# Patient Record
Sex: Male | Born: 1970
Health system: Southern US, Community
[De-identification: ages and names within clinical notes are randomized; demographics above are authoritative.]

## PROBLEM LIST (undated history)

## (undated) DIAGNOSIS — R972 Elevated prostate specific antigen [PSA]: Secondary | ICD-10-CM

## (undated) DIAGNOSIS — R12 Heartburn: Secondary | ICD-10-CM

## (undated) DIAGNOSIS — E669 Obesity, unspecified: Secondary | ICD-10-CM

## (undated) HISTORY — PX: PROSTATE BIOPSY: SHX241

## (undated) HISTORY — DX: Heartburn: R12

---

## 2010-12-24 ENCOUNTER — Ambulatory Visit: Payer: Self-pay | Admitting: Family Medicine

## 2017-11-04 DIAGNOSIS — E669 Obesity, unspecified: Secondary | ICD-10-CM | POA: Diagnosis not present

## 2017-11-04 DIAGNOSIS — E785 Hyperlipidemia, unspecified: Secondary | ICD-10-CM | POA: Diagnosis not present

## 2017-11-04 DIAGNOSIS — M25562 Pain in left knee: Secondary | ICD-10-CM | POA: Diagnosis not present

## 2017-11-18 DIAGNOSIS — M1712 Unilateral primary osteoarthritis, left knee: Secondary | ICD-10-CM | POA: Diagnosis not present

## 2018-03-16 DIAGNOSIS — H10413 Chronic giant papillary conjunctivitis, bilateral: Secondary | ICD-10-CM | POA: Diagnosis not present

## 2018-05-25 DIAGNOSIS — M1712 Unilateral primary osteoarthritis, left knee: Secondary | ICD-10-CM | POA: Diagnosis not present

## 2018-06-18 DIAGNOSIS — Z0000 Encounter for general adult medical examination without abnormal findings: Secondary | ICD-10-CM | POA: Diagnosis not present

## 2018-06-18 DIAGNOSIS — R072 Precordial pain: Secondary | ICD-10-CM | POA: Diagnosis not present

## 2018-06-18 DIAGNOSIS — R12 Heartburn: Secondary | ICD-10-CM | POA: Diagnosis not present

## 2018-08-04 DIAGNOSIS — J018 Other acute sinusitis: Secondary | ICD-10-CM | POA: Diagnosis not present

## 2018-08-04 DIAGNOSIS — H5789 Other specified disorders of eye and adnexa: Secondary | ICD-10-CM | POA: Diagnosis not present

## 2018-12-03 DIAGNOSIS — M1712 Unilateral primary osteoarthritis, left knee: Secondary | ICD-10-CM | POA: Insufficient documentation

## 2018-12-03 DIAGNOSIS — H01004 Unspecified blepharitis left upper eyelid: Secondary | ICD-10-CM | POA: Diagnosis not present

## 2018-12-03 DIAGNOSIS — H01001 Unspecified blepharitis right upper eyelid: Secondary | ICD-10-CM | POA: Diagnosis not present

## 2018-12-03 DIAGNOSIS — H16202 Unspecified keratoconjunctivitis, left eye: Secondary | ICD-10-CM | POA: Diagnosis not present

## 2019-05-09 HISTORY — PX: KNEE ARTHROSCOPY: SHX127

## 2020-08-08 ENCOUNTER — Ambulatory Visit (INDEPENDENT_AMBULATORY_CARE_PROVIDER_SITE_OTHER): Payer: 59 | Admitting: Legal Medicine

## 2020-08-08 ENCOUNTER — Encounter: Payer: Self-pay | Admitting: Legal Medicine

## 2020-08-08 ENCOUNTER — Other Ambulatory Visit: Payer: Self-pay

## 2020-08-08 DIAGNOSIS — M170 Bilateral primary osteoarthritis of knee: Secondary | ICD-10-CM

## 2020-08-08 DIAGNOSIS — Z0000 Encounter for general adult medical examination without abnormal findings: Secondary | ICD-10-CM | POA: Insufficient documentation

## 2020-08-08 NOTE — Patient Instructions (Signed)

## 2020-08-08 NOTE — Progress Notes (Signed)
Subjective:  Patient ID: Harry Drake, male    DOB: 1971-04-03  Age: 50 y.o. MRN: [id number]  Chief Complaint  Patient presents with  . Annual Exam    Physical     HPI  Well Adult Physical: Patient here for a comprehensive physical exam.The patient reports problems - osteoarthritis knees, has arthrotomy Do you take any herbs or supplements that were not prescribed by a doctor? yes Are you taking calcium supplements? yes Are you taking aspirin daily? yes  Encounter for general adult medical examination without abnormal findings  Physical ("At Risk" items are starred): Patient's last physical exam was 1 year ago .  Smoking: Life-long non-smoker ;  Physical Activity: Exercises at least 3 times per week ;  Alcohol/Drug Use: Is a non-drinker ; No illicit drug use ;  Patient is not afflicted from Stress Incontinence and Urge Incontinence  Safety: reviewed ; Patient wears a seat belt, has smoke detectors, has carbon monoxide detectors, practices appropriate gun safety, and wears sunscreen with extended sun exposure. Dental Care: biannual cleanings, brushes and flosses daily. Ophthalmology/Optometry: Annual visit.  Hearing loss: none Vision impairments: none Last PSA:            Social History   Socioeconomic History  . Marital status: Single    Spouse name: Not on file  . Number of children: Not on file  . Years of education: Not on file  . Highest education level: Not on file  Occupational History  . Occupation: Art therapist at [employer]  Tobacco Use  . Smoking status: Never Smoker  . Smokeless tobacco: Never Used  Substance and Sexual Activity  . Alcohol use: Yes    Comment: occasionally   . Drug use: Never  . Sexual activity: Not on file  Other Topics Concern  . Not on file  Social History Narrative  . Not on file   Social Determinants of Health   Financial Resource Strain: Not on file  Food Insecurity: Not on file  Transportation Needs: Not on  file  Physical Activity: Not on file  Stress: Not on file  Social Connections: Not on file   Past Medical History:  Diagnosis Date  . Heartburn    Past Surgical History:  Procedure Laterality Date  . KNEE ARTHROSCOPY  05/2019   R knee    Family History  Problem Relation Age of Onset  . Kidney cancer Father   . Prostate cancer Father    Social History   Socioeconomic History  . Marital status: Single    Spouse name: Not on file  . Number of children: Not on file  . Years of education: Not on file  . Highest education level: Not on file  Occupational History  . Occupation: Art therapist at [employer]  Tobacco Use  . Smoking status: Never Smoker  . Smokeless tobacco: Never Used  Substance and Sexual Activity  . Alcohol use: Yes    Comment: occasionally   . Drug use: Never  . Sexual activity: Not on file  Other Topics Concern  . Not on file  Social History Narrative  . Not on file   Social Determinants of Health   Financial Resource Strain: Not on file  Food Insecurity: Not on file  Transportation Needs: Not on file  Physical Activity: Not on file  Stress: Not on file  Social Connections: Not on file   Review of Systems  Constitutional: Negative for activity change and appetite change.  HENT: Negative for congestion and sinus pressure.   Eyes: Negative for visual disturbance.  Respiratory: Negative for chest tightness and shortness of breath.   Cardiovascular: Negative for chest pain, palpitations and leg swelling.  Gastrointestinal: Negative.  Negative for abdominal distention and abdominal pain.  Endocrine: Negative for polyuria.  Genitourinary: Negative for difficulty urinating, dysuria and urgency.  Musculoskeletal: Positive for arthralgias and back pain.  Skin: Negative.   Neurological: Negative.   Psychiatric/Behavioral: Negative.      Objective:  BP 126/84 (BP Location: Right Arm, Patient Position: Sitting, Cuff Size: Normal)    Pulse 70   Temp (!) 97.3 F (36.3 C) (Temporal)   Resp 16   Ht 5\' 6"  (1.676 m)   Wt 249 lb (112.9 kg)   SpO2 97%   BMI 40.19 kg/m   BP/Weight 08/08/2020  Systolic BP 126  Diastolic BP 84  Wt. (Lbs) 249  BMI 40.19    Physical Exam Vitals reviewed.  Constitutional:      General: He is not in acute distress.    Appearance: Normal appearance.  HENT:     Head: Normocephalic.     Right Ear: Tympanic membrane, ear canal and external ear normal.     Left Ear: Tympanic membrane, ear canal and external ear normal.     Mouth/Throat:     Mouth: Mucous membranes are moist.     Pharynx: Oropharynx is clear.  Eyes:     Extraocular Movements: Extraocular movements intact.     Conjunctiva/sclera: Conjunctivae normal.     Pupils: Pupils are equal, round, and reactive to light.  Cardiovascular:     Rate and Rhythm: Normal rate and regular rhythm.     Pulses: Normal pulses.     Heart sounds: No murmur heard. No gallop.   Pulmonary:     Effort: Pulmonary effort is normal.     Breath sounds: Normal breath sounds. No rales.  Abdominal:     General: Abdomen is flat. Bowel sounds are normal. There is no distension.     Palpations: Abdomen is soft.     Tenderness: There is no abdominal tenderness.  Musculoskeletal:        General: Normal range of motion.     Cervical back: Normal range of motion. No tenderness.     Right lower leg: No edema.     Left lower leg: No edema.  Skin:    General: Skin is warm.     Capillary Refill: Capillary refill takes less than 2 seconds.  Neurological:     General: No focal deficit present.     Mental Status: He is alert and oriented to person, place, and time. Mental status is at baseline.     No results found for: WBC, HGB, HCT, PLT, GLUCOSE, CHOL, TRIG, HDL, LDLDIRECT, LDLCALC, ALT, AST, NA, K, CL, CREATININE, BUN, CO2, TSH, PSA, INR, GLUF, HGBA1C, MICROALBUR    Assessment & Plan:  Diagnoses and all orders for this visit: Localized osteoarthritis  of both knees Patient has osteoarthritis both knees  Routine general medical examination at a health care facility -     CBC with Differential/Platelet -     Comprehensive metabolic panel -     Lipid panel -     PSA Routine instructions with weight loss and care of arthritis.  regular activity. He will need colonoscopy, he is considering  Body mass index is 40.19 kg/m.   These are the goals we discussed: Goals    . DIET -  EAT MORE FRUITS AND VEGETABLES        This is a list of the screening recommended for you and due dates:  Health Maintenance  Topic Date Due  .  Hepatitis C: One time screening is recommended by Center for Disease Control  (CDC) for  adults born from [birth year] through 1966.   Never done  . COVID-19 Vaccine (1) Never done  . HIV Screening  Never done  . Tetanus Vaccine  Never done  . Colon Cancer Screening  Never done  . Flu Shot  Never done     AN INDIVIDUALIZED CARE PLAN: was established or reinforced today.   SELF MANAGEMENT: The patient and I together assessed ways to personally work towards obtaining the recommended goals  Support needs The patient and/or family needs were assessed and services were offered if appropriate.  No orders of the defined types were placed in this encounter.    Follow-up: Return in about 1 year (around 08/08/2021).  An After Visit Summary was printed and given to the patient.  Brent Bulla, MD [facility] [phone]

## 2020-08-09 ENCOUNTER — Other Ambulatory Visit: Payer: Self-pay | Admitting: Legal Medicine

## 2020-08-09 DIAGNOSIS — E782 Mixed hyperlipidemia: Secondary | ICD-10-CM

## 2020-08-09 LAB — CBC WITH DIFFERENTIAL/PLATELET
Basophils Absolute: 0.2 10*3/uL (ref 0.0–0.2)
Basos: 2 %
EOS (ABSOLUTE): 0.4 10*3/uL (ref 0.0–0.4)
Eos: 4 %
Hematocrit: 46.5 % (ref 37.5–51.0)
Hemoglobin: 16.2 g/dL (ref 13.0–17.7)
Immature Grans (Abs): 0.1 10*3/uL (ref 0.0–0.1)
Immature Granulocytes: 1 %
Lymphocytes Absolute: 2.8 10*3/uL (ref 0.7–3.1)
Lymphs: 30 %
MCH: 30 pg (ref 26.6–33.0)
MCHC: 34.8 g/dL (ref 31.5–35.7)
MCV: 86 fL (ref 79–97)
Monocytes Absolute: 0.8 10*3/uL (ref 0.1–0.9)
Monocytes: 9 %
Neutrophils Absolute: 5.1 10*3/uL (ref 1.4–7.0)
Neutrophils: 54 %
Platelets: 307 10*3/uL (ref 150–450)
RBC: 5.4 x10E6/uL (ref 4.14–5.80)
RDW: 13.6 % (ref 11.6–15.4)
WBC: 9.3 10*3/uL (ref 3.4–10.8)

## 2020-08-09 LAB — LIPID PANEL
Chol/HDL Ratio: 4.6 ratio (ref 0.0–5.0)
Cholesterol, Total: 211 mg/dL — ABNORMAL HIGH (ref 100–199)
HDL: 46 mg/dL (ref >39)
LDL Chol Calc (NIH): 147 mg/dL — ABNORMAL HIGH (ref 0–99)
Triglycerides: 98 mg/dL (ref 0–149)
VLDL Cholesterol Cal: 18 mg/dL (ref 5–40)

## 2020-08-09 LAB — COMPREHENSIVE METABOLIC PANEL
ALT: 39 IU/L (ref 0–44)
AST: 26 IU/L (ref 0–40)
Albumin/Globulin Ratio: 2 (ref 1.2–2.2)
Albumin: 4.8 g/dL (ref 4.0–5.0)
Alkaline Phosphatase: 112 IU/L (ref 44–121)
BUN/Creatinine Ratio: 15 (ref 9–20)
BUN: 14 mg/dL (ref 6–24)
Bilirubin Total: 0.4 mg/dL (ref 0.0–1.2)
CO2: 23 mmol/L (ref 20–29)
Calcium: 9.9 mg/dL (ref 8.7–10.2)
Chloride: 105 mmol/L (ref 96–106)
Creatinine, Ser: 0.96 mg/dL (ref 0.76–1.27)
GFR calc Af Amer: 106 mL/min/{1.73_m2} (ref >59)
GFR calc non Af Amer: 92 mL/min/{1.73_m2} (ref >59)
Globulin, Total: 2.4 g/dL (ref 1.5–4.5)
Glucose: 89 mg/dL (ref 65–99)
Potassium: 4.7 mmol/L (ref 3.5–5.2)
Sodium: 142 mmol/L (ref 134–144)
Total Protein: 7.2 g/dL (ref 6.0–8.5)

## 2020-08-09 LAB — CARDIOVASCULAR RISK ASSESSMENT

## 2020-08-09 LAB — PSA: Prostate Specific Ag, Serum: 1.9 ng/mL (ref 0.0–4.0)

## 2020-08-09 MED ORDER — ATORVASTATIN CALCIUM 40 MG PO TABS: 40.0000 mg | ORAL_TABLET | Freq: Every day | ORAL | 3 refills | Status: DC

## 2020-08-09 NOTE — Progress Notes (Signed)
Cbc all normal, kidney and liver tests normal, cholesterol 211, LDL cholesterol 147 high consider start ing a statin to lower cardiovascular risks, PSA 1.9 normal,  lp

## 2020-08-09 NOTE — Progress Notes (Signed)
Atorvastatin called in, follow up patient in 3 months lp

## 2021-06-28 ENCOUNTER — Other Ambulatory Visit: Payer: Self-pay | Admitting: Family Medicine

## 2021-06-28 DIAGNOSIS — R2242 Localized swelling, mass and lump, left lower limb: Secondary | ICD-10-CM

## 2021-07-04 ENCOUNTER — Other Ambulatory Visit: Payer: 59

## 2021-07-10 ENCOUNTER — Ambulatory Visit
Admission: RE | Admit: 2021-07-10 | Discharge: 2021-07-10 | Disposition: A | Discharge status: Home / Self Care | Payer: Self-pay | Source: Ambulatory Visit | Attending: Family Medicine | Admitting: Family Medicine

## 2021-07-10 DIAGNOSIS — R2242 Localized swelling, mass and lump, left lower limb: Secondary | ICD-10-CM

## 2022-02-24 IMAGING — US US EXTREM LOW*L* LIMITED
1 series · 12 of 12 positions shown · non-contrast
Comparison: Knee series 03/19/2021.

CLINICAL DATA: Skin mass and bruising over left lower leg.

EXAM:
ULTRASOUND LEFT LOWER EXTREMITY LIMITED
TECHNIQUE: Ultrasound examination of the lower extremity soft tissues was
performed in the area of clinical concern.

[Series 1: us extrem low*left* limited · 0.06mm/px · 12 of 12 slices shown]
[im 1/12]
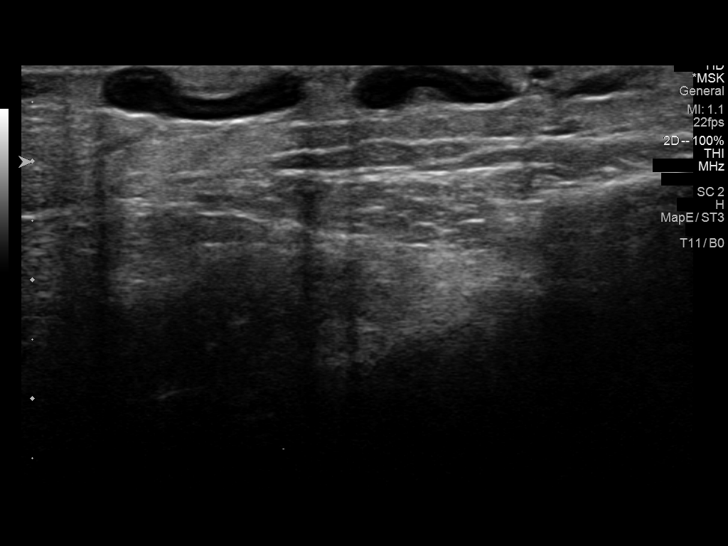
[im 2/12]
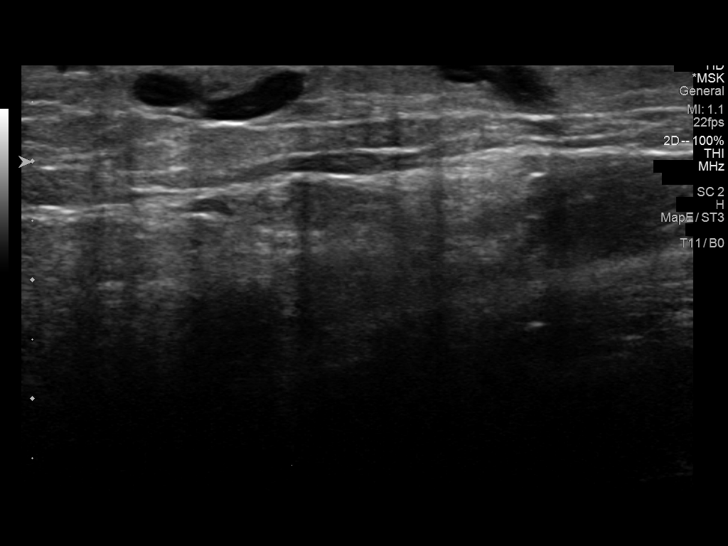
[im 3/12]
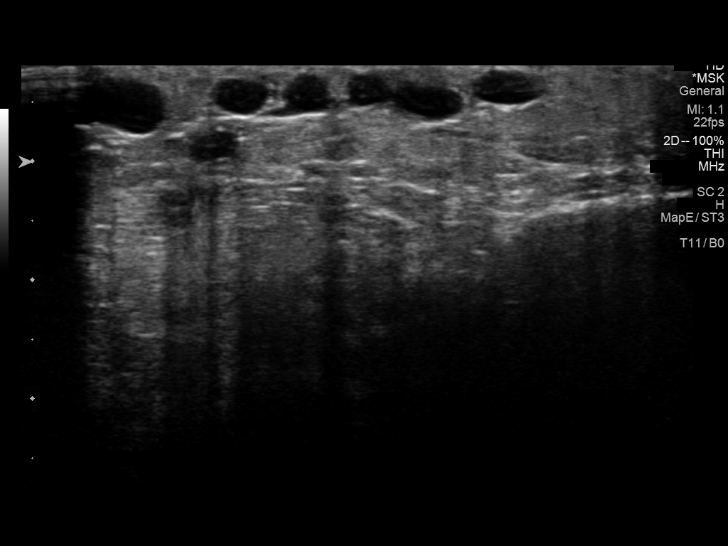
[im 4/12]
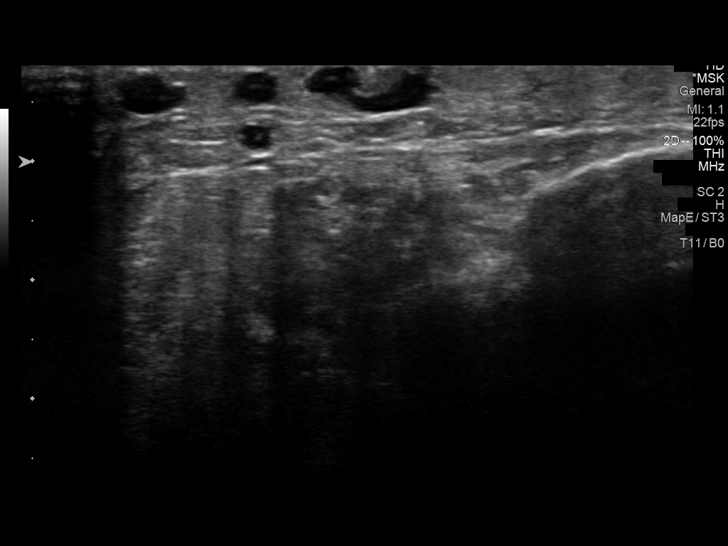
[im 5/12]
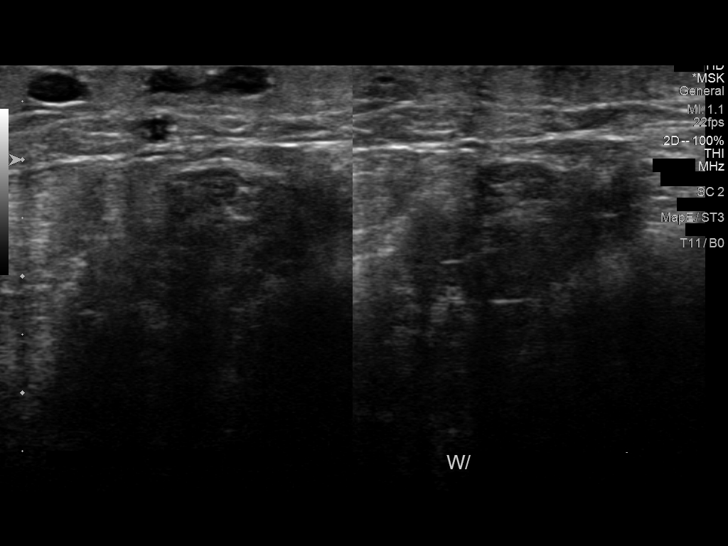
[im 6/12]
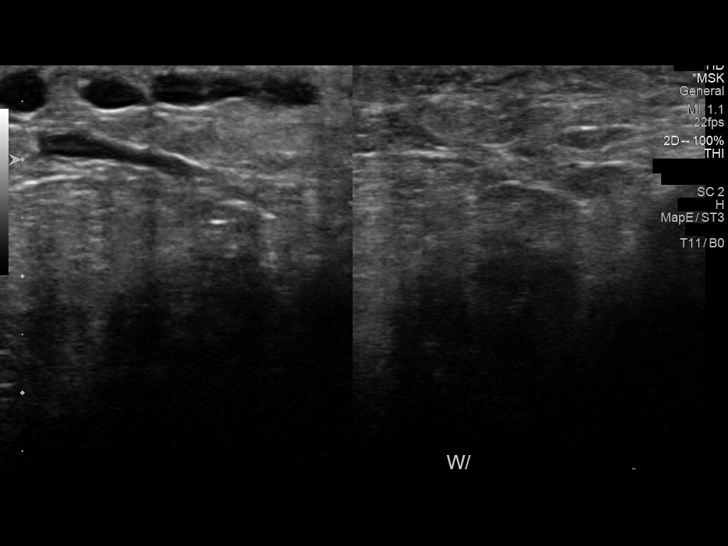
[im 7/12]
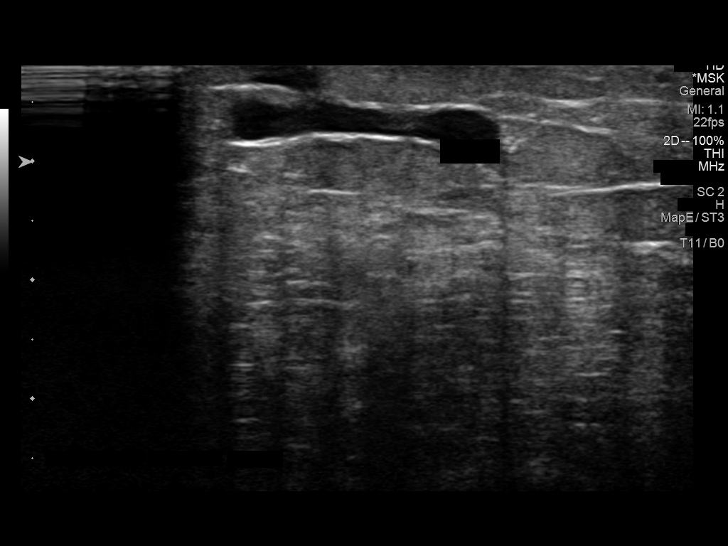
[im 8/12]
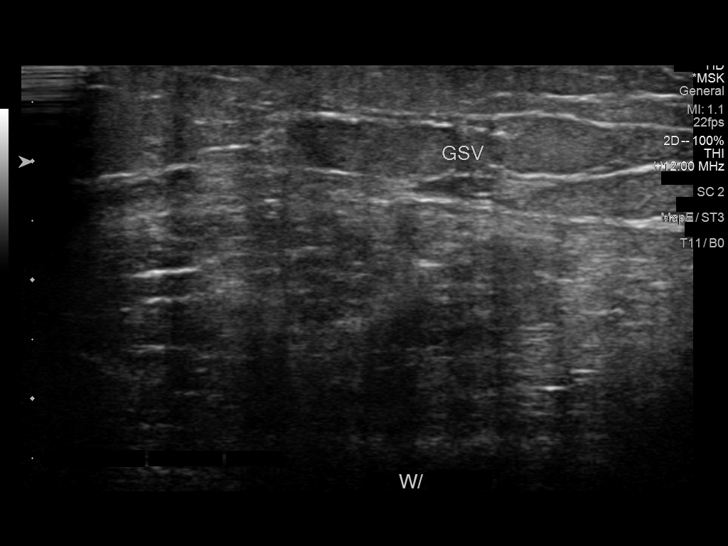
[im 9/12]
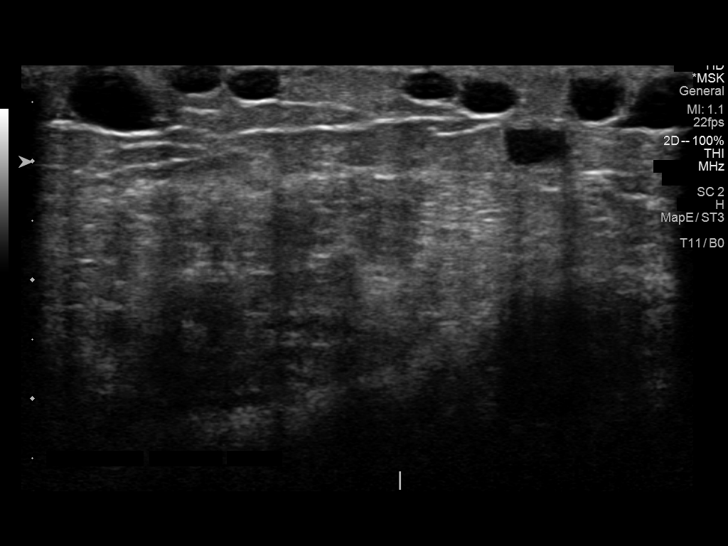
[im 10/12]
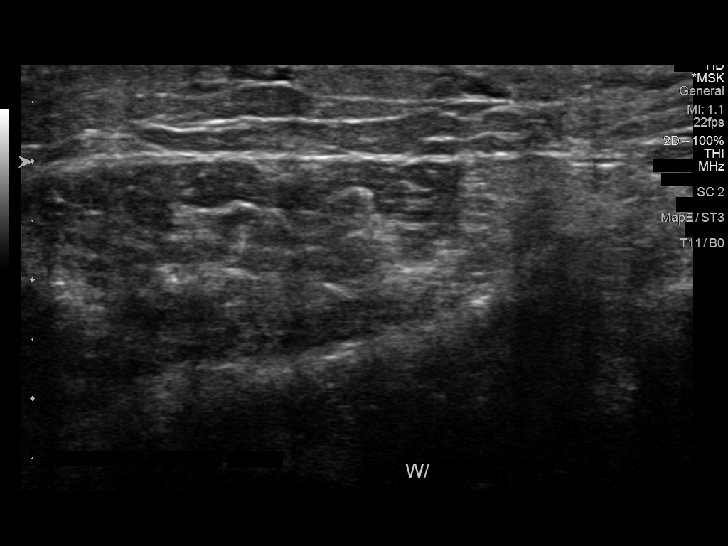
[im 11/12]
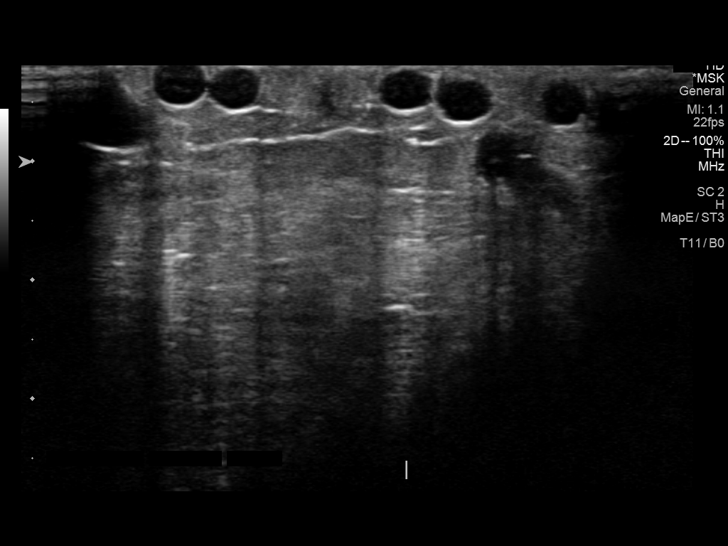
[im 12/12]
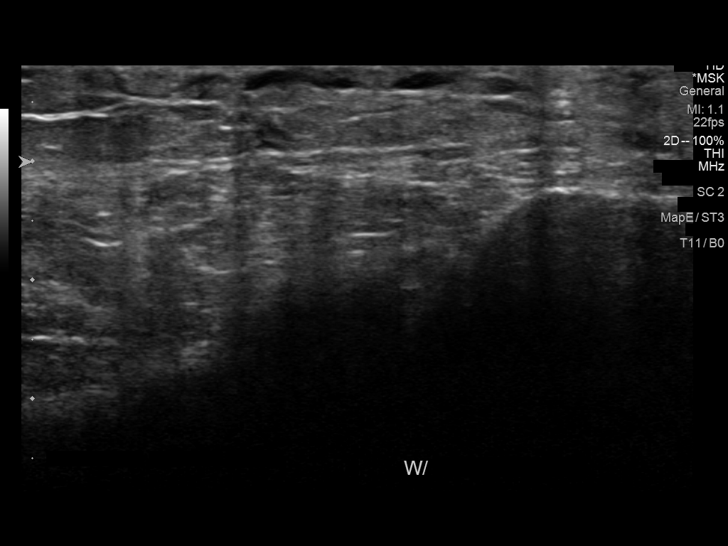

[12 of 12 positions shown; findings below may reference images not displayed]

FINDINGS: Multiple superficial varicose veins are noted. These appear to be
patent and compressible. No abnormal fluid collections. No soft
tissue mass noted.
IMPRESSION: 1. Multiple superficial varicose veins. These appear patent and
compressible.

2.  No abnormal fluid collections or mass noted.

## 2022-04-01 DIAGNOSIS — M170 Bilateral primary osteoarthritis of knee: Secondary | ICD-10-CM | POA: Diagnosis not present

## 2022-04-09 DIAGNOSIS — M170 Bilateral primary osteoarthritis of knee: Secondary | ICD-10-CM | POA: Diagnosis not present

## 2022-06-17 DIAGNOSIS — H52203 Unspecified astigmatism, bilateral: Secondary | ICD-10-CM | POA: Diagnosis not present

## 2022-06-17 DIAGNOSIS — H0012 Chalazion right lower eyelid: Secondary | ICD-10-CM | POA: Diagnosis not present

## 2022-06-20 DIAGNOSIS — M1712 Unilateral primary osteoarthritis, left knee: Secondary | ICD-10-CM | POA: Diagnosis not present

## 2022-06-20 DIAGNOSIS — M25562 Pain in left knee: Secondary | ICD-10-CM | POA: Diagnosis not present

## 2022-06-20 DIAGNOSIS — Z01812 Encounter for preprocedural laboratory examination: Secondary | ICD-10-CM | POA: Diagnosis not present

## 2022-07-03 DIAGNOSIS — M1712 Unilateral primary osteoarthritis, left knee: Secondary | ICD-10-CM | POA: Diagnosis not present

## 2022-07-03 DIAGNOSIS — R262 Difficulty in walking, not elsewhere classified: Secondary | ICD-10-CM | POA: Diagnosis not present

## 2022-07-03 DIAGNOSIS — M25662 Stiffness of left knee, not elsewhere classified: Secondary | ICD-10-CM | POA: Diagnosis not present

## 2022-07-10 DIAGNOSIS — M1712 Unilateral primary osteoarthritis, left knee: Secondary | ICD-10-CM | POA: Diagnosis not present

## 2022-07-10 DIAGNOSIS — G8918 Other acute postprocedural pain: Secondary | ICD-10-CM | POA: Diagnosis not present

## 2022-07-12 DIAGNOSIS — M1712 Unilateral primary osteoarthritis, left knee: Secondary | ICD-10-CM | POA: Diagnosis not present

## 2022-07-18 ENCOUNTER — Other Ambulatory Visit (HOSPITAL_COMMUNITY): Payer: Self-pay | Admitting: Orthopedic Surgery

## 2022-07-18 DIAGNOSIS — M79605 Pain in left leg: Secondary | ICD-10-CM

## 2022-07-19 ENCOUNTER — Ambulatory Visit (HOSPITAL_COMMUNITY)
Admission: RE | Admit: 2022-07-19 | Discharge: 2022-07-19 | Disposition: A | Discharge status: Home / Self Care | Payer: BC Managed Care – PPO | Source: Ambulatory Visit | Attending: Orthopedic Surgery | Admitting: Orthopedic Surgery

## 2022-07-19 DIAGNOSIS — M79605 Pain in left leg: Secondary | ICD-10-CM

## 2022-07-19 DIAGNOSIS — M7989 Other specified soft tissue disorders: Secondary | ICD-10-CM | POA: Insufficient documentation

## 2022-07-19 NOTE — Progress Notes (Deleted)
Lower extremity venous bilateral study completed.   Please see CV Proc for preliminary results.   Marit Goodwill, RDMS, RVT  

## 2022-07-19 NOTE — Progress Notes (Signed)
Lower extremity venous left study completed.  Preliminary results relayed to Zachery Dakins, MD  See CV Proc for preliminary results report.   Darlin Coco, RDMS, RVT

## 2022-07-29 DIAGNOSIS — E7800 Pure hypercholesterolemia, unspecified: Secondary | ICD-10-CM | POA: Diagnosis not present

## 2022-07-29 DIAGNOSIS — Z125 Encounter for screening for malignant neoplasm of prostate: Secondary | ICD-10-CM | POA: Diagnosis not present

## 2022-07-29 DIAGNOSIS — Z0000 Encounter for general adult medical examination without abnormal findings: Secondary | ICD-10-CM | POA: Diagnosis not present

## 2022-07-29 DIAGNOSIS — Z23 Encounter for immunization: Secondary | ICD-10-CM | POA: Diagnosis not present

## 2023-01-07 DIAGNOSIS — M25561 Pain in right knee: Secondary | ICD-10-CM | POA: Diagnosis not present

## 2023-02-07 DIAGNOSIS — Z20822 Contact with and (suspected) exposure to covid-19: Secondary | ICD-10-CM | POA: Diagnosis not present

## 2023-02-07 DIAGNOSIS — J069 Acute upper respiratory infection, unspecified: Secondary | ICD-10-CM | POA: Diagnosis not present

## 2023-03-18 DIAGNOSIS — M542 Cervicalgia: Secondary | ICD-10-CM | POA: Diagnosis not present

## 2023-03-18 DIAGNOSIS — M25561 Pain in right knee: Secondary | ICD-10-CM | POA: Diagnosis not present

## 2023-03-18 DIAGNOSIS — M25511 Pain in right shoulder: Secondary | ICD-10-CM | POA: Diagnosis not present

## 2023-03-21 DIAGNOSIS — Z23 Encounter for immunization: Secondary | ICD-10-CM | POA: Diagnosis not present

## 2023-03-21 DIAGNOSIS — M1711 Unilateral primary osteoarthritis, right knee: Secondary | ICD-10-CM | POA: Diagnosis not present

## 2023-03-27 DIAGNOSIS — M542 Cervicalgia: Secondary | ICD-10-CM | POA: Diagnosis not present

## 2023-05-06 DIAGNOSIS — M5032 Other cervical disc degeneration, mid-cervical region, unspecified level: Secondary | ICD-10-CM | POA: Diagnosis not present

## 2023-05-06 DIAGNOSIS — M5412 Radiculopathy, cervical region: Secondary | ICD-10-CM | POA: Diagnosis not present

## 2023-05-14 DIAGNOSIS — M5412 Radiculopathy, cervical region: Secondary | ICD-10-CM | POA: Diagnosis not present

## 2023-05-19 DIAGNOSIS — M5412 Radiculopathy, cervical region: Secondary | ICD-10-CM | POA: Diagnosis not present

## 2023-05-22 DIAGNOSIS — M1711 Unilateral primary osteoarthritis, right knee: Secondary | ICD-10-CM | POA: Diagnosis not present

## 2023-05-22 DIAGNOSIS — M25561 Pain in right knee: Secondary | ICD-10-CM | POA: Diagnosis not present

## 2023-05-27 DIAGNOSIS — M5412 Radiculopathy, cervical region: Secondary | ICD-10-CM | POA: Diagnosis not present

## 2023-05-28 DIAGNOSIS — Z1389 Encounter for screening for other disorder: Secondary | ICD-10-CM | POA: Diagnosis not present

## 2023-05-28 DIAGNOSIS — R0981 Nasal congestion: Secondary | ICD-10-CM | POA: Diagnosis not present

## 2023-05-28 DIAGNOSIS — Z0130 Encounter for examination of blood pressure without abnormal findings: Secondary | ICD-10-CM | POA: Diagnosis not present

## 2023-05-29 DIAGNOSIS — F439 Reaction to severe stress, unspecified: Secondary | ICD-10-CM | POA: Diagnosis not present

## 2023-05-29 DIAGNOSIS — G4700 Insomnia, unspecified: Secondary | ICD-10-CM | POA: Diagnosis not present

## 2023-06-09 DIAGNOSIS — M5412 Radiculopathy, cervical region: Secondary | ICD-10-CM | POA: Diagnosis not present

## 2023-06-20 DIAGNOSIS — M1711 Unilateral primary osteoarthritis, right knee: Secondary | ICD-10-CM | POA: Diagnosis not present

## 2023-06-20 DIAGNOSIS — M25761 Osteophyte, right knee: Secondary | ICD-10-CM | POA: Diagnosis not present

## 2023-06-25 DIAGNOSIS — H01004 Unspecified blepharitis left upper eyelid: Secondary | ICD-10-CM | POA: Diagnosis not present

## 2023-06-25 DIAGNOSIS — H01001 Unspecified blepharitis right upper eyelid: Secondary | ICD-10-CM | POA: Diagnosis not present

## 2023-08-05 DIAGNOSIS — Z23 Encounter for immunization: Secondary | ICD-10-CM | POA: Diagnosis not present

## 2023-08-05 DIAGNOSIS — Z0000 Encounter for general adult medical examination without abnormal findings: Secondary | ICD-10-CM | POA: Diagnosis not present

## 2023-08-05 DIAGNOSIS — E7800 Pure hypercholesterolemia, unspecified: Secondary | ICD-10-CM | POA: Diagnosis not present

## 2023-08-05 DIAGNOSIS — Z125 Encounter for screening for malignant neoplasm of prostate: Secondary | ICD-10-CM | POA: Diagnosis not present

## 2023-08-12 DIAGNOSIS — R972 Elevated prostate specific antigen [PSA]: Secondary | ICD-10-CM | POA: Diagnosis not present

## 2023-08-19 DIAGNOSIS — Z01818 Encounter for other preprocedural examination: Secondary | ICD-10-CM | POA: Diagnosis not present

## 2023-08-26 DIAGNOSIS — K5730 Diverticulosis of large intestine without perforation or abscess without bleeding: Secondary | ICD-10-CM | POA: Diagnosis not present

## 2023-08-26 DIAGNOSIS — D125 Benign neoplasm of sigmoid colon: Secondary | ICD-10-CM | POA: Diagnosis not present

## 2023-08-26 DIAGNOSIS — Z1211 Encounter for screening for malignant neoplasm of colon: Secondary | ICD-10-CM | POA: Diagnosis not present

## 2023-08-26 DIAGNOSIS — D124 Benign neoplasm of descending colon: Secondary | ICD-10-CM | POA: Diagnosis not present

## 2023-09-29 DIAGNOSIS — C61 Malignant neoplasm of prostate: Secondary | ICD-10-CM | POA: Diagnosis not present

## 2023-09-29 DIAGNOSIS — N4289 Other specified disorders of prostate: Secondary | ICD-10-CM | POA: Diagnosis not present

## 2023-09-29 DIAGNOSIS — R972 Elevated prostate specific antigen [PSA]: Secondary | ICD-10-CM | POA: Diagnosis not present

## 2023-10-13 DIAGNOSIS — C61 Malignant neoplasm of prostate: Secondary | ICD-10-CM | POA: Diagnosis not present

## 2023-10-20 NOTE — Progress Notes (Signed)
 GU Location of Tumor / Histology: Prostate Ca  If Prostate Cancer, Gleason Score is (3 + 4) and PSA is (5.28 on 08/05/2023)  Harry Drake presented as referral from Dr. Osborn Bl[facility]) elevated PSA.  Biopsies      Past/Anticipated interventions by urology, if any:     Past/Anticipated interventions by medical oncology, if any: NA  Weight changes, if any: {:18581}  IPSS: SHIM:  Bowel/Bladder complaints, if any: {:18581}   Nausea/Vomiting, if any: {:18581}  Pain issues, if any:  {:18581}  SAFETY ISSUES: Prior radiation? {:18581} Pacemaker/ICD? {:18581} Possible current pregnancy? Male Is the patient on methotrexate? No  Current Complaints / other details:

## 2023-10-22 NOTE — Progress Notes (Signed)
 Radiation Oncology         [phone] ________________________________  Initial Outpatient Consultation  Name: Harry Drake MRN: [id number]  Date: 10/23/2023  DOB: [birth year]  [id number], Harry Hua, MD  Harry Drake., *   REFERRING PHYSICIAN: Loletta Drake., *  DIAGNOSIS: 53 y.o. gentleman with Stage T1c adenocarcinoma of the prostate with Gleason score of 3+4, and PSA of 5.28.    ICD-10-CM   1. Malignant neoplasm of prostate (HCC)  C61       HISTORY OF PRESENT ILLNESS: Harry Drake is a 52 y.o. male with a diagnosis of prostate cancer. He was noted to have a rising, elevated PSA of 5.28 by his primary care physician, Dr. Azucena Drake.  Prior PSAs have been 3.7 in 2024 and 2.5 in 2023.  Accordingly, he was referred for evaluation in urology by Dr. Berneice Heinrich on 08/12/23,  digital rectal examination performed at that time showed no nodules or induration.  The patient proceeded to transrectal ultrasound with 12 biopsies of the prostate on 09/29/23.  The prostate volume measured 59 cc.  Out of 12 core biopsies, 5 were positive, all on the left side.  The maximum Gleason score was 3+4, and this was seen in the left base lateral and left base. Additionally, Gleason 3+3 was seen in the left mid lateral, left mid, and left apex.  The patient reviewed the biopsy results with his urologist and he has kindly been referred today for discussion of potential radiation treatment options.   PREVIOUS RADIATION THERAPY: No  PAST MEDICAL HISTORY:  Past Medical History:  Diagnosis Date   Elevated PSA    Heartburn    Obesity       PAST SURGICAL HISTORY: Past Surgical History:  Procedure Laterality Date   KNEE ARTHROSCOPY  05/2019   R knee   PROSTATE BIOPSY      FAMILY HISTORY:  Family History  Problem Relation Age of Onset   Kidney cancer Father    Prostate cancer Father     SOCIAL HISTORY:  Social History   Socioeconomic History   Marital status: Single    Spouse name: Not on file    Number of children: Not on file   Years of education: Not on file   Highest education level: Not on file  Occupational History   Occupation: Art therapist at [employer]  Tobacco Use   Smoking status: Never   Smokeless tobacco: Never  Vaping Use   Vaping status: Never Used  Substance and Sexual Activity   Alcohol use: Yes    Alcohol/week: 4.0 standard drinks of alcohol    Types: 4 Cans of beer per week    Comment: occa   Drug use: Never   Sexual activity: Not Currently  Other Topics Concern   Not on file  Social History Narrative   Not on file   Social Drivers of Health   Financial Resource Strain: Not on file  Food Insecurity: No Food Insecurity (10/23/2023)   Hunger Vital Sign    Worried About Running Out of Food in the Last Year: Never true    Ran Out of Food in the Last Year: Never true  Transportation Needs: No Transportation Needs (10/23/2023)   PRAPARE - Administrator, Civil Service (Medical): No    Lack of Transportation (Non-Medical): No  Physical Activity: Not on file  Stress: Not on file  Social Connections: Not on file  Intimate Partner Violence: Not At Risk (10/23/2023)  Humiliation, Afraid, Rape, and Kick questionnaire    Fear of Current or Ex-Partner: No    Emotionally Abused: No    Physically Abused: No    Sexually Abused: No    ALLERGIES: Patient has no known allergies.  MEDICATIONS:  Current Outpatient Medications  Medication Sig Dispense Refill   amoxicillin (AMOXIL) 500 MG capsule Dental procedure.     No current facility-administered medications for this encounter.    REVIEW OF SYSTEMS:  On review of systems, the patient reports that he is doing well overall. He denies any chest pain, shortness of breath, cough, fevers, chills, night sweats, unintended weight changes. He denies any bowel disturbances, and denies abdominal pain, nausea or vomiting. He denies any new musculoskeletal or joint aches or pains. His IPSS  was 10, indicating mild-moderate urinary symptoms. His SHIM was 25, indicating he does not have erectile dysfunction. A complete review of systems is obtained and is otherwise negative.    PHYSICAL EXAM:  Wt Readings from Last 3 Encounters:  10/23/23 230 lb 4 oz (104.4 kg)  08/08/20 249 lb (112.9 kg)   Temp Readings from Last 3 Encounters:  10/23/23 (!) 97.3 F (36.3 C) (Temporal)  08/08/20 (!) 97.3 F (36.3 C) (Temporal)   BP Readings from Last 3 Encounters:  10/23/23 (!) 143/91  08/08/20 126/84   Pulse Readings from Last 3 Encounters:  10/23/23 66  08/08/20 70   Pain Assessment Pain Score: 0-No pain/10  In general this is a well appearing Caucasian male in no acute distress. He's alert and oriented x4 and appropriate throughout the examination. Cardiopulmonary assessment is negative for acute distress, and he exhibits normal effort.     KPS = 100  100 - Normal; no complaints; no evidence of disease. 90   - Able to carry on normal activity; minor signs or symptoms of disease. 80   - Normal activity with effort; some signs or symptoms of disease. 69   - Cares for self; unable to carry on normal activity or to do active work. 60   - Requires occasional assistance, but is able to care for most of his personal needs. 50   - Requires considerable assistance and frequent medical care. 40   - Disabled; requires special care and assistance. 30   - Severely disabled; hospital admission is indicated although death not imminent. 20   - Very sick; hospital admission necessary; active supportive treatment necessary. 10   - Moribund; fatal processes progressing rapidly. 0     - Dead  Karnofsky DA, Abelmann WH, Craver LS and Burchenal [facility] [phone]) The use of the nitrogen mustards in the palliative treatment of carcinoma: with particular reference to bronchogenic carcinoma Cancer 1 634-56  LABORATORY DATA:  Lab Results  Component Value Date   WBC 9.3 08/08/2020   HGB 16.2 08/08/2020    HCT 46.5 08/08/2020   MCV 86 08/08/2020   PLT 307 08/08/2020   Lab Results  Component Value Date   NA 142 08/08/2020   K 4.7 08/08/2020   CL 105 08/08/2020   CO2 23 08/08/2020   Lab Results  Component Value Date   ALT 39 08/08/2020   AST 26 08/08/2020   ALKPHOS 112 08/08/2020   BILITOT 0.4 08/08/2020     RADIOGRAPHY: No results found.    IMPRESSION/PLAN: 1. 53 y.o. gentleman with Stage T1c adenocarcinoma of the prostate with Gleason Score of 3+4, and PSA of 5.28. We discussed the patient's workup and outlined the nature of prostate cancer  in this setting. The patient's T stage, Gleason's score, and PSA put him into the favorable intermediate risk group. Accordingly, he is eligible for a variety of potential treatment options including brachytherapy, 5.5 weeks of external radiation, or prostatectomy. We discussed the available radiation techniques, and focused on the details and logistics of delivery. We discussed and outlined the risks, benefits, short and long-term effects associated with radiotherapy and compared and contrasted these with prostatectomy. We discussed the role of SpaceOAR gel in reducing the rectal toxicity associated with radiotherapy. He appears to have a good understanding of his disease and our treatment recommendations which are of curative intent.  He was encouraged to ask questions that were answered to his stated satisfaction.  At the conclusion of our conversation, the patient is undecided regarding his treatment preference and would like to take some additional time to consider his options.  He is also seeking a second opinion with one of the urologists at [facility] and plans to make a decision shortly thereafter.  He has our contact information and will let us  know if he ultimately elects to proceed with brachytherapy or 5.5 weeks of IMRT and we will move forward with treatment planning accordingly at that time.  We enjoyed meeting him today and look forward to  continuing to participate in his care.  We personally spent 70 minutes in this encounter including chart review, reviewing radiological studies, meeting face-to-face with the patient, entering orders and completing documentation.    Arta Bihari, PA-C    Kenith Payer, MD  [facility]  Radiation Oncology Direct Dial: [phone]  Fax: [phone] [email or web address]  Skype  [email or web address]   This document serves as a record of services personally performed by Kenith Payer, MD and Keitha Pata, PA-C. It was created on their behalf by Florance Hun, a trained medical scribe. The creation of this record is based on the scribe's personal observations and the provider's statements to them. This document has been checked and approved by the attending provider.

## 2023-10-23 ENCOUNTER — Ambulatory Visit
Admission: RE | Admit: 2023-10-23 | Discharge: 2023-10-23 | Disposition: A | Discharge status: Home / Self Care | Source: Ambulatory Visit | Attending: Radiation Oncology | Admitting: Radiation Oncology

## 2023-10-23 ENCOUNTER — Encounter: Payer: Self-pay | Admitting: Urology

## 2023-10-23 ENCOUNTER — Encounter: Payer: Self-pay | Admitting: Radiation Oncology

## 2023-10-23 VITALS — BP 143/91 | HR 66 | Temp 97.3°F | Resp 18 | Ht 67.0 in | Wt 230.2 lb

## 2023-10-23 DIAGNOSIS — R12 Heartburn: Secondary | ICD-10-CM | POA: Insufficient documentation

## 2023-10-23 DIAGNOSIS — C61 Malignant neoplasm of prostate: Secondary | ICD-10-CM | POA: Insufficient documentation

## 2023-10-23 DIAGNOSIS — Z8052 Family history of malignant neoplasm of bladder: Secondary | ICD-10-CM | POA: Diagnosis not present

## 2023-10-23 DIAGNOSIS — Z8042 Family history of malignant neoplasm of prostate: Secondary | ICD-10-CM | POA: Insufficient documentation

## 2023-10-23 HISTORY — DX: Elevated prostate specific antigen (PSA): R97.20

## 2023-10-23 HISTORY — DX: Obesity, unspecified: E66.9

## 2023-10-24 ENCOUNTER — Other Ambulatory Visit: Payer: Self-pay

## 2023-10-24 DIAGNOSIS — C61 Malignant neoplasm of prostate: Secondary | ICD-10-CM

## 2023-10-24 NOTE — Progress Notes (Signed)
 Introduced myself to the patient as the prostate nurse navigator.  He is here to discuss his radiation treatment options and would like to proceed with a second opinion at [facility].  Patient's PCP placed referral, however, patietn does not have apt at this time. I informed patient I would follow up to ensure he is able to receive consult at [facility] as well.  I gave him my business card and asked him to call me with questions or concerns.  Verbalized understanding.

## 2023-10-24 NOTE — Progress Notes (Signed)
 RN reached out to [facility] to follow up on referral that was originally sent by patients PCP.  No referral in process.  RN successfully faxed new referral with records.  Will continue to follow.

## 2023-10-30 NOTE — Progress Notes (Signed)
 Patient is now scheduled at [facility] for second opinion on 5/12 @ 3pm.  RN review appointment with patient.  Additional information sent to [facility] as well per patient request.  Patient agreeable for follow up after [facility] appointment.

## 2023-11-04 DIAGNOSIS — C61 Malignant neoplasm of prostate: Secondary | ICD-10-CM | POA: Diagnosis not present

## 2023-11-17 DIAGNOSIS — C61 Malignant neoplasm of prostate: Secondary | ICD-10-CM | POA: Diagnosis not present

## 2023-11-20 NOTE — Progress Notes (Signed)
 Patient had his consult at [facility] on 5/12.  Patient has reached out with additional questions regarding brachytherapy.  All questions answered, written education provided.  Patient still considering his options at this time.  Will continue to follow.

## 2023-12-04 NOTE — Progress Notes (Signed)
 Patient wishes to learn more about SBRT for his stage T1c adenocarcinoma of the prostate with Gleason score of 3+4, and PSA of 5.28.   Patient will have additional consult for this at [facility] on June 23rd.  Patient agreeable for follow up after consult to review decisions or additional questions.

## 2023-12-29 DIAGNOSIS — C61 Malignant neoplasm of prostate: Secondary | ICD-10-CM | POA: Diagnosis not present

## 2023-12-30 NOTE — Progress Notes (Signed)
 Patient had consult with [facility] on 6/23.  RN reached out to patient to follow up after consult. Will continue to follow.

## 2024-01-02 NOTE — Progress Notes (Signed)
 Patient will proceed with SBRT @ [facility].

## 2024-01-08 DIAGNOSIS — M1711 Unilateral primary osteoarthritis, right knee: Secondary | ICD-10-CM | POA: Diagnosis not present

## 2024-01-27 DIAGNOSIS — C61 Malignant neoplasm of prostate: Secondary | ICD-10-CM | POA: Diagnosis not present

## 2024-02-09 DIAGNOSIS — C61 Malignant neoplasm of prostate: Secondary | ICD-10-CM | POA: Diagnosis not present

## 2024-02-11 DIAGNOSIS — C61 Malignant neoplasm of prostate: Secondary | ICD-10-CM | POA: Diagnosis not present

## 2024-02-13 DIAGNOSIS — C61 Malignant neoplasm of prostate: Secondary | ICD-10-CM | POA: Diagnosis not present

## 2024-02-16 DIAGNOSIS — C61 Malignant neoplasm of prostate: Secondary | ICD-10-CM | POA: Diagnosis not present

## 2024-02-18 DIAGNOSIS — C61 Malignant neoplasm of prostate: Secondary | ICD-10-CM | POA: Diagnosis not present

## 2024-02-20 DIAGNOSIS — C61 Malignant neoplasm of prostate: Secondary | ICD-10-CM | POA: Diagnosis not present

## 2024-05-27 DIAGNOSIS — C61 Malignant neoplasm of prostate: Secondary | ICD-10-CM | POA: Diagnosis not present

## 2024-06-16 DIAGNOSIS — M5412 Radiculopathy, cervical region: Secondary | ICD-10-CM | POA: Diagnosis not present

## 2024-06-26 DIAGNOSIS — M542 Cervicalgia: Secondary | ICD-10-CM | POA: Diagnosis not present
